# Patient Record
Sex: Female | Born: 1980 | Hispanic: Yes | Marital: Married | Smoking: Never smoker
Health system: Southern US, Community
[De-identification: ages and names within clinical notes are randomized; demographics above are authoritative.]

## PROBLEM LIST (undated history)

## (undated) DIAGNOSIS — I10 Essential (primary) hypertension: Secondary | ICD-10-CM

---

## 2005-01-21 ENCOUNTER — Emergency Department: Payer: Self-pay | Admitting: Emergency Medicine

## 2005-07-02 ENCOUNTER — Ambulatory Visit: Payer: Self-pay | Admitting: *Deleted

## 2006-02-10 ENCOUNTER — Ambulatory Visit (HOSPITAL_COMMUNITY): Admission: RE | Admit: 2006-02-10 | Discharge: 2006-02-10 | Payer: Self-pay | Admitting: Specialist

## 2009-10-03 ENCOUNTER — Ambulatory Visit (HOSPITAL_COMMUNITY): Admission: RE | Admit: 2009-10-03 | Discharge: 2009-10-03 | Payer: Self-pay | Admitting: Obstetrics

## 2013-01-07 ENCOUNTER — Encounter (HOSPITAL_COMMUNITY): Payer: Self-pay | Admitting: Emergency Medicine

## 2013-01-07 ENCOUNTER — Emergency Department (HOSPITAL_COMMUNITY)
Admission: EM | Admit: 2013-01-07 | Discharge: 2013-01-07 | Disposition: A | Discharge status: Home / Self Care | Payer: Self-pay | Attending: Emergency Medicine | Admitting: Emergency Medicine

## 2013-01-07 ENCOUNTER — Emergency Department (HOSPITAL_COMMUNITY): Payer: Self-pay

## 2013-01-07 DIAGNOSIS — J069 Acute upper respiratory infection, unspecified: Secondary | ICD-10-CM | POA: Insufficient documentation

## 2013-01-07 DIAGNOSIS — I10 Essential (primary) hypertension: Secondary | ICD-10-CM | POA: Insufficient documentation

## 2013-01-07 DIAGNOSIS — M549 Dorsalgia, unspecified: Secondary | ICD-10-CM | POA: Insufficient documentation

## 2013-01-07 DIAGNOSIS — J40 Bronchitis, not specified as acute or chronic: Secondary | ICD-10-CM

## 2013-01-07 DIAGNOSIS — J209 Acute bronchitis, unspecified: Secondary | ICD-10-CM | POA: Insufficient documentation

## 2013-01-07 HISTORY — DX: Essential (primary) hypertension: I10

## 2013-01-07 MED ORDER — ALBUTEROL SULFATE (5 MG/ML) 0.5% IN NEBU
2.5000 mg | INHALATION_SOLUTION | Freq: Once | RESPIRATORY_TRACT | Status: AC
  Administered 2013-01-07: 2.5 mg via RESPIRATORY_TRACT
  Filled 2013-01-07: qty 0.5

## 2013-01-07 MED ORDER — IBUPROFEN 800 MG PO TABS
800.0000 mg | ORAL_TABLET | Freq: Once | ORAL | Status: AC
  Administered 2013-01-07: 800 mg via ORAL
  Filled 2013-01-07: qty 1

## 2013-01-07 MED ORDER — ALBUTEROL SULFATE HFA 108 (90 BASE) MCG/ACT IN AERS
2.0000 | INHALATION_SPRAY | Freq: Once | RESPIRATORY_TRACT | Status: AC
  Administered 2013-01-07: 2 via RESPIRATORY_TRACT
  Filled 2013-01-07: qty 6.7

## 2013-01-07 MED ORDER — IPRATROPIUM BROMIDE 0.02 % IN SOLN
0.5000 mg | Freq: Once | RESPIRATORY_TRACT | Status: AC
  Administered 2013-01-07: 0.5 mg via RESPIRATORY_TRACT
  Filled 2013-01-07: qty 2.5

## 2013-01-07 MED ORDER — DIPHENHYDRAMINE HCL 12.5 MG/5ML PO ELIX
25.0000 mg | ORAL_SOLUTION | Freq: Once | ORAL | Status: AC
  Administered 2013-01-07: 25 mg via ORAL
  Filled 2013-01-07: qty 10

## 2013-01-07 NOTE — ED Provider Notes (Signed)
CSN: [id number]     Arrival date & time 01/07/13  1928 History   First MD Initiated Contact with Patient 01/07/13 1948     Chief Complaint  Patient presents with  . Bronchitis   (Consider location/radiation/quality/duration/timing/severity/associated sxs/prior Treatment) Patient is a 32 y.o. female presenting with cough. The history is provided by the patient.  Cough Cough characteristics:  Non-productive Severity:  Moderate Onset quality:  Gradual Duration:  4 days Progression:  Worsening Chronicity:  New Smoker: no   Context: sick contacts and weather changes   Relieved by:  Nothing Ineffective treatments:  Rest (amoxil) Associated symptoms: myalgias   Associated symptoms: no chest pain, no eye discharge, no shortness of breath and no wheezing   Risk factors: no recent travel     Past Medical History  Diagnosis Date  . Hypertension    History reviewed. No pertinent past surgical history. History reviewed. No pertinent family history. History  Substance Use Topics  . Smoking status: Never Smoker   . Smokeless tobacco: Not on file  . Alcohol Use: No   OB History   Grav Para Term Preterm Abortions TAB SAB Ect Mult Living                 Review of Systems  Constitutional: Negative for activity change.       All ROS Neg except as noted in HPI  HENT: Positive for congestion. Negative for nosebleeds.   Eyes: Negative for photophobia and discharge.  Respiratory: Positive for cough. Negative for shortness of breath and wheezing.   Cardiovascular: Negative for chest pain and palpitations.  Gastrointestinal: Negative for abdominal pain and blood in stool.  Genitourinary: Negative for dysuria, frequency and hematuria.  Musculoskeletal: Positive for back pain and myalgias. Negative for arthralgias and neck pain.  Skin: Negative.   Neurological: Negative for dizziness, seizures and speech difficulty.  Psychiatric/Behavioral: Negative for hallucinations and confusion.     Allergies  Review of patient's allergies indicates no known allergies.  Home Medications   Current Outpatient Rx  Name  Route  Sig  Dispense  Refill  . amoxicillin (AMOXIL) 875 MG tablet   Oral   Take 875 mg by mouth 2 (two) times daily. 10 day course starting on 01/06/2013         . predniSONE (DELTASONE) 20 MG tablet   Oral   Take 60 mg by mouth daily. 5 day course starting on 01/06/2013          BP 156/73  Pulse 80  Temp(Src) 97.7 F (36.5 C) (Oral)  Resp 20  Ht 5\' 1"  (1.549 m)  Wt 205 lb 3 oz (93.072 kg)  BMI 38.79 kg/m2  SpO2 99%  LMP 12/13/2012 Physical Exam  Nursing note and vitals reviewed. Constitutional: She is oriented to person, place, and time. She appears well-developed and well-nourished.  Non-toxic appearance.  HENT:  Head: Normocephalic.  Right Ear: Tympanic membrane and external ear normal.  Left Ear: Tympanic membrane and external ear normal.  Nasal congestion present.  Eyes: EOM and lids are normal. Pupils are equal, round, and reactive to light.  Neck: Normal range of motion. Neck supple. Carotid bruit is not present.  Cardiovascular: Normal rate, regular rhythm, normal heart sounds, intact distal pulses and normal pulses.   Pulmonary/Chest: No respiratory distress. She has wheezes. She has rhonchi. She exhibits tenderness.  Abdominal: Soft. Bowel sounds are normal. There is no tenderness. There is no guarding.  Musculoskeletal: Normal range of motion.  Lymphadenopathy:  Head (right side): No submandibular adenopathy present.       Head (left side): No submandibular adenopathy present.    She has no cervical adenopathy.  Neurological: She is alert and oriented to person, place, and time. She has normal strength. No cranial nerve deficit or sensory deficit.  Skin: Skin is warm and dry.  Psychiatric: She has a normal mood and affect. Her speech is normal.    ED Course  Procedures (including critical care time) Labs Review Labs  Reviewed - No data to display Imaging Review Dg Chest 2 View  01/07/2013   CLINICAL DATA:  Shortness of breath for 1 week and cough.  EXAM: CHEST  2 VIEW  COMPARISON:  None.  FINDINGS: Low lung volumes accentuate the heart size of toes normal. Low lung volumes also result in mild crowding of the pulmonary markings without infiltrate or atelectasis. No effusion or pneumothorax. No osseous findings.  IMPRESSION: No active cardiopulmonary disease.   Electronically Signed   By: Davonna Belling M.D.   On: 01/07/2013 20:23    EKG Interpretation   None       MDM  No diagnosis found. *I have reviewed nursing notes, vital signs, and all appropriate lab and imaging results for this patient.**  Chest xray reveals mld crowding, but no pneumonia or acute pulmonary problem.Suspect bronchitis and URI. PUlse ox 99% on room air. WNL by my interpretation. Plan add albuterol inhaler to the amoxil and prednisone currently taking.Pt to use benadryl for congestion. She will return if any changes or problem.  Kathie Dike, PA-C 01/08/13 1208

## 2013-01-07 NOTE — ED Notes (Signed)
Cough, no fever, hoarse, headache,  Seen by MD yesterday and started amoxicillin and prednisone.    Feels no better.

## 2013-01-26 NOTE — ED Provider Notes (Signed)
History/physical exam/procedure(s) were performed by non-physician practitioner and as supervising physician I was immediately available for consultation/collaboration. I have reviewed all notes and am in agreement with care and plan.   Hilario Quarry, MD 01/26/13 [phone]

## 2014-04-21 IMAGING — CR DG CHEST 2V
2 series · 2 of 2 positions shown · non-contrast
Comparison: None.

CLINICAL DATA: Shortness of breath for 1 week and cough.

EXAM:
CHEST  2 VIEW

[view not recorded (1 of 2)]
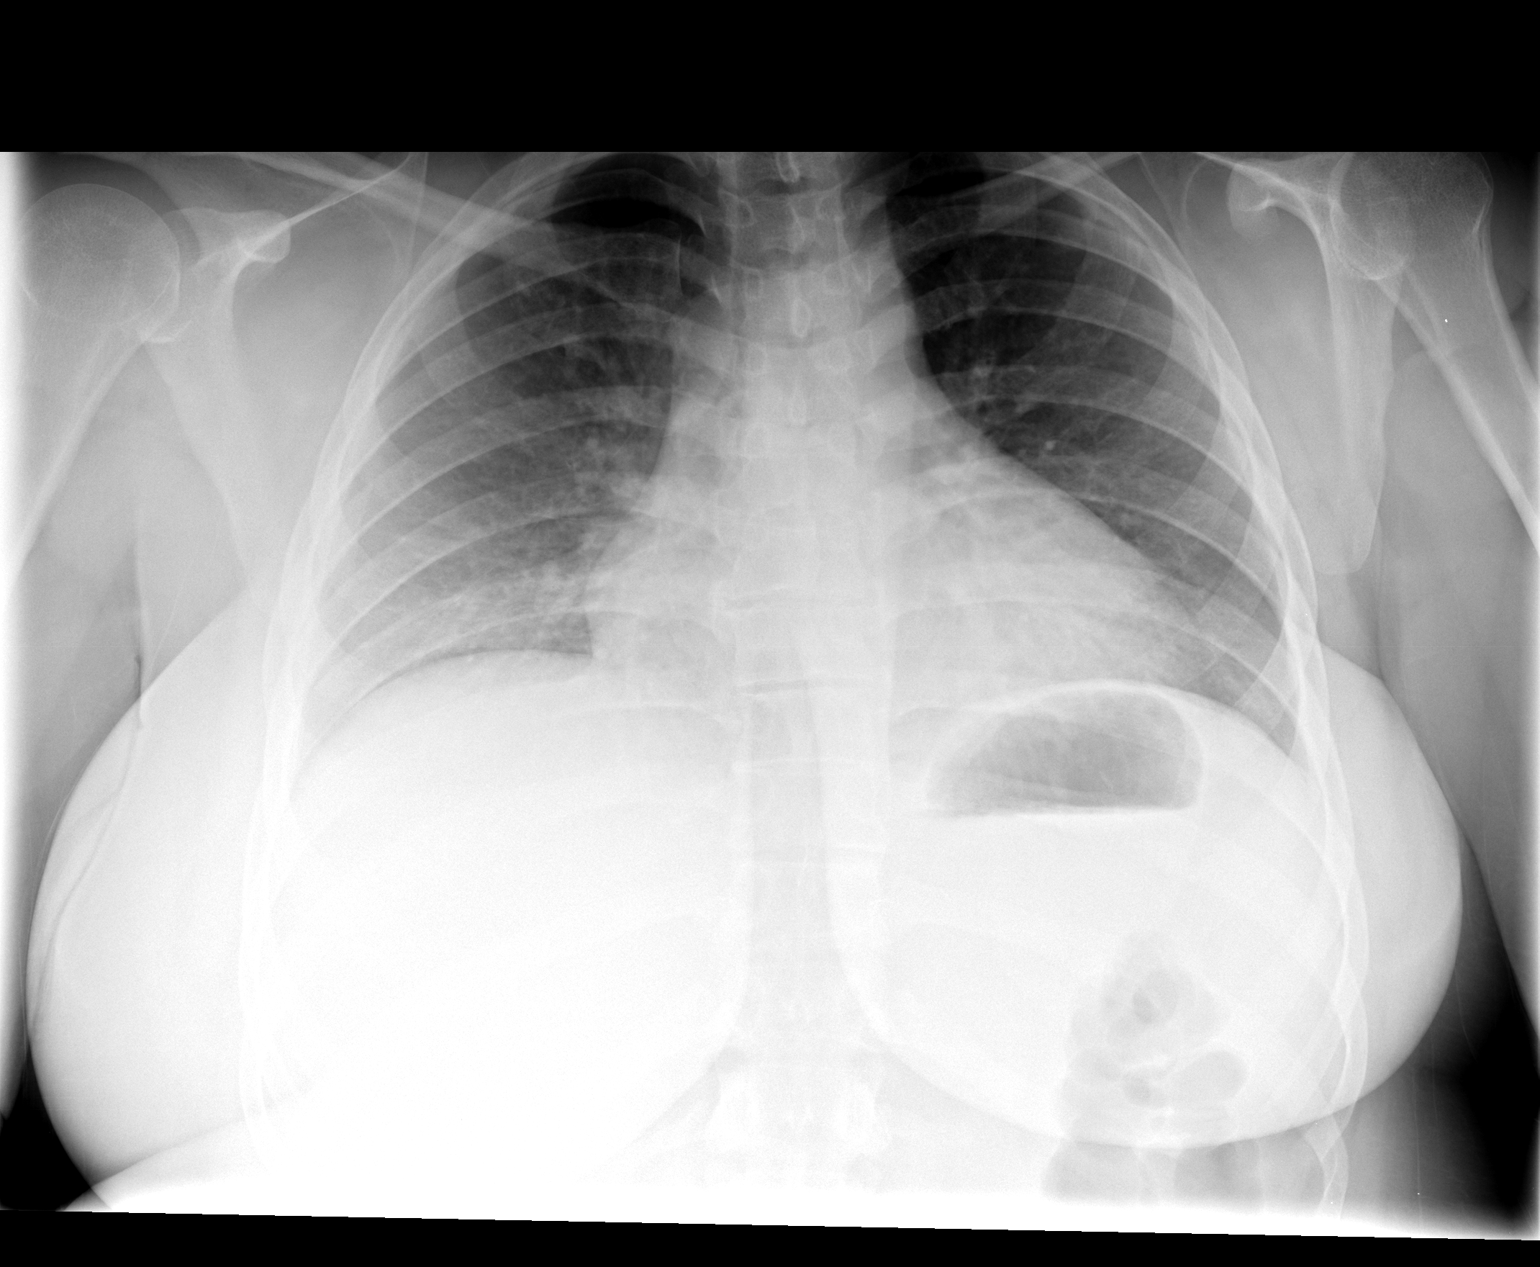

[view not recorded (2 of 2)]
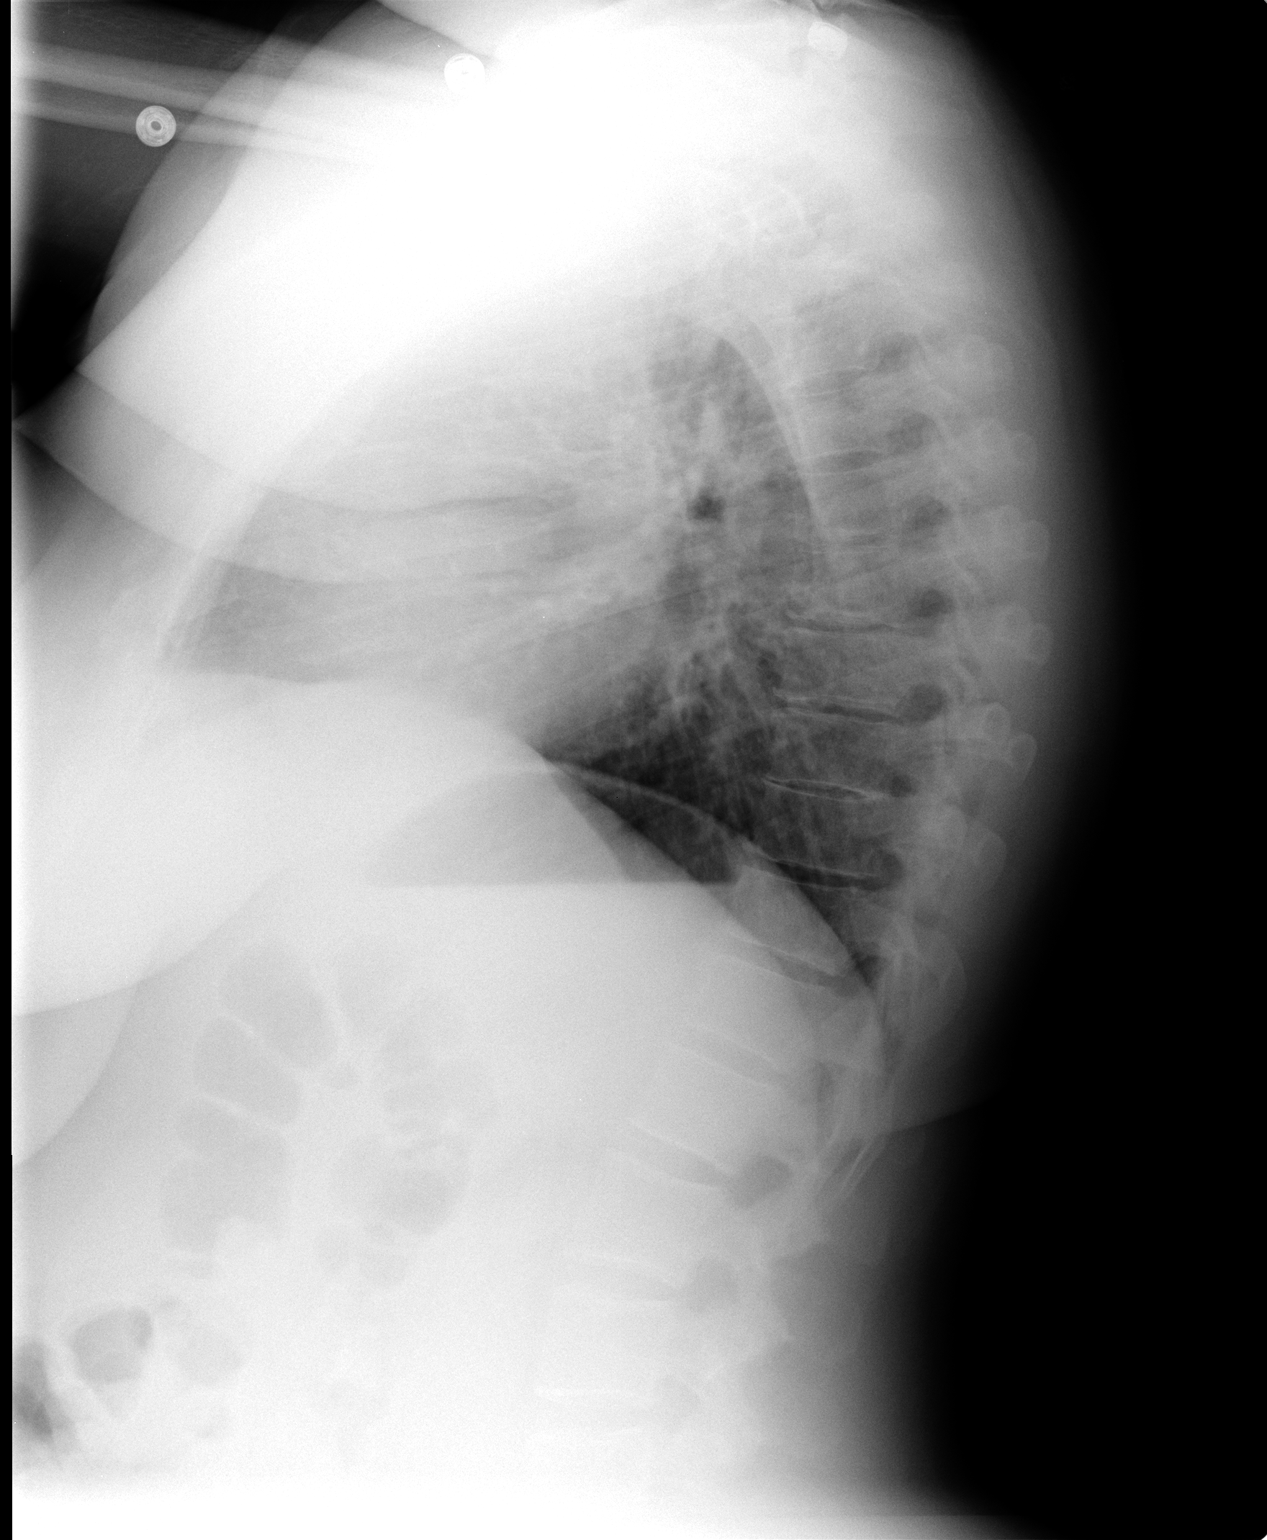

[2 of 2 positions shown; findings below may reference images not displayed]

FINDINGS: Low lung volumes accentuate the heart size of toes normal. Low lung
volumes also result in mild crowding of the pulmonary markings
without infiltrate or atelectasis. No effusion or pneumothorax. No
osseous findings.
IMPRESSION: No active cardiopulmonary disease.

## 2014-05-26 ENCOUNTER — Emergency Department (HOSPITAL_COMMUNITY)
Admission: EM | Admit: 2014-05-26 | Discharge: 2014-05-26 | Disposition: A | Discharge status: Home / Self Care | Payer: Self-pay | Attending: Emergency Medicine | Admitting: Emergency Medicine

## 2014-05-26 ENCOUNTER — Encounter (HOSPITAL_COMMUNITY): Payer: Self-pay | Admitting: *Deleted

## 2014-05-26 DIAGNOSIS — I10 Essential (primary) hypertension: Secondary | ICD-10-CM | POA: Insufficient documentation

## 2014-05-26 DIAGNOSIS — G510 Bell's palsy: Secondary | ICD-10-CM | POA: Insufficient documentation

## 2014-05-26 DIAGNOSIS — Z7952 Long term (current) use of systemic steroids: Secondary | ICD-10-CM | POA: Insufficient documentation

## 2014-05-26 DIAGNOSIS — Z792 Long term (current) use of antibiotics: Secondary | ICD-10-CM | POA: Insufficient documentation

## 2014-05-26 MED ORDER — ACYCLOVIR 400 MG PO TABS: 400.0000 mg | ORAL_TABLET | Freq: Every day | ORAL | Status: AC

## 2014-05-26 MED ORDER — PREDNISONE 20 MG PO TABS: ORAL_TABLET | ORAL | Status: DC

## 2014-05-26 NOTE — ED Notes (Signed)
Pt alert & oriented x4, stable gait. Patient given discharge instructions, paperwork & prescription(s). Patient  instructed to stop at the registration desk to finish any additional paperwork. Patient verbalized understanding. Pt left department w/ no further questions. 

## 2014-05-26 NOTE — ED Provider Notes (Addendum)
CSN: [id number]     Arrival date & time 05/26/14  4098 History   First MD Initiated Contact with Patient 05/26/14 [phone]     Chief Complaint  Patient presents with  . Facial Droop     (Consider location/radiation/quality/duration/timing/severity/associated sxs/prior Treatment) HPI Comments: Patient presents to the ER for evaluation of right facial droop and numbness. Patient reports that she woke this morning with symptoms, was fine last night. She noticed when she tried to brush her teeth at the toothpaste and water was drooping of the right side of her mouth. The right side of her face is very numb and tingly. She has noticed some blurred vision as well. She has not noticed any numbness, tingling or weakness in the extremities.   Past Medical History  Diagnosis Date  . Hypertension    History reviewed. No pertinent past surgical history. History reviewed. No pertinent family history. History  Substance Use Topics  . Smoking status: Never Smoker   . Smokeless tobacco: Not on file  . Alcohol Use: No   OB History    No data available     Review of Systems  Neurological: Positive for numbness (Right facial). Negative for headaches.  All other systems reviewed and are negative.     Allergies  Review of patient's allergies indicates no known allergies.  Home Medications   Prior to Admission medications   Medication Sig Start Date End Date Taking? Authorizing Provider  amoxicillin (AMOXIL) 875 MG tablet Take 875 mg by mouth 2 (two) times daily. 10 day course starting on 01/06/2013    Historical Provider, MD  predniSONE (DELTASONE) 20 MG tablet Take 60 mg by mouth daily. 5 day course starting on 01/06/2013    Historical Provider, MD   BP 167/101 mmHg  Pulse 88  Temp(Src) 98.1 F (36.7 C) (Oral)  Resp 20  Ht 5' (1.524 m)  Wt 201 lb (91.173 kg)  BMI 39.26 kg/m2  SpO2 99%  LMP 05/22/2014 (Exact Date) Physical Exam  Constitutional: She is oriented to person, place, and  time. She appears well-developed and well-nourished. No distress.  HENT:  Head: Normocephalic and atraumatic.  Right Ear: Hearing normal.  Left Ear: Hearing normal.  Nose: Nose normal.  Mouth/Throat: Oropharynx is clear and moist and mucous membranes are normal.  Eyes: Conjunctivae and EOM are normal. Pupils are equal, round, and reactive to light.  Neck: Normal range of motion. Neck supple.  Cardiovascular: Regular rhythm, S1 normal and S2 normal.  Exam reveals no gallop and no friction rub.   No murmur heard. Pulmonary/Chest: Effort normal and breath sounds normal. No respiratory distress. She exhibits no tenderness.  Abdominal: Soft. Normal appearance and bowel sounds are normal. There is no hepatosplenomegaly. There is no tenderness. There is no rebound, no guarding, no tenderness at McBurney's point and negative Murphy's sign. No hernia.  Musculoskeletal: Normal range of motion.  Neurological: She is alert and oriented to person, place, and time. She has normal strength. 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 No sensory deficit. Coordination normal. GCS eye subscore is 4. GCS verbal subscore is 5. GCS motor subscore is 6.  Patient has partial paralysis of the right facial nerve. This includes upper motor neuron on right, cannot completely close her right eye and has difficulty moving her forehead as well as the lower muscles of the face.  Extraocular muscle movement: normal No visual field cut Pupils: equal and reactive both direct and consensual response is normal No nystagmus  present    Sensory function is intact to light touch, pinprick Proprioception intact  Grip strength 5/5 symmetric in upper extremities No pronator drift Normal finger to nose bilaterally  Lower extremity strength 5/5 against gravity   Gait: normal   Skin: Skin is warm, dry and intact. No rash noted. No cyanosis.  Psychiatric: She has a normal mood and affect. Her speech is normal  and behavior is normal. Thought content normal.  Nursing note and vitals reviewed.   ED Course  Procedures (including critical care time) Labs Review Labs Reviewed - No data to display  Imaging Review No results found.   EKG Interpretation None      MDM   Final diagnoses:  None   Bell's palsy  Patient presents to the emergency department for evaluation of right facial numbness and weakness that was present upon waking this morning. Patient has an examination that is very consistent with Bell's palsy. She has partial paralysis of upper and lower motor neuron on the right, no other neurologic symptoms elsewhere in her body. Based on her examination, she does not require imaging. She will be treated empirically with prednisone and acyclovir.    Gilda Creasehristopher J Maicie Vanderloop, MD 05/26/14 [phone]  Gilda Creasehristopher J Ndidi Nesby, MD 05/26/14 [phone]

## 2014-05-26 NOTE — ED Notes (Signed)
Pt states that last night she had no symptoms, upon awakening pt states she had rt sided mouth numbness and lt eye blurred vision. Pt unable to close lt eye, EDP at bedside to assess, neuro exam other than facial problems WDL.

## 2014-05-26 NOTE — Discharge Instructions (Signed)
Parálisis de Bell °(Bell's Palsy) °La parálisis de Bell es un trastorno en el que los músculos de un lado de la cara no pueden moverse (parálisis). Esto se debe a que los nervios de la cara están paralizados. Se cree que es producido por un virus. El virus causa inflamación del nervio que controla los movimientos de un lado de la cara. El nervio pasa a través de un espacio angosto, rodeado de hueso. Cuando el nervio se inflama, el hueso puede comprimirlo. Esto da como resultado un daño a la cubierta protectora que rodea el nervio. Este daño interfiere con la forma en la que el nervio se comunica con los músculos de la cara. Como resultado, puede causar debilidad o parálisis en los músculos faciales.  °Las lesiones (traumatismos), tumores y cirugía pueden causar parálisis de Bell, pero la mayoría de las veces se desconoce la causa. Es un trastorno bastante frecuente. Comienza de repente (aparición abrupta) y la parálisis por lo general desaparece en 2 días. La parálisis de Bell no es peligrosa. Pero como el ojo no se cierra adecuadamente, necesitará tomar algunos recaudos para que no se seque. Esto puede incluir un vendaje (mantener el ojo cerrado) o humedecerlo con lágrimas artificiales. Rara vez ocurre en ambos lados al mismo tiempo. °SÍNTOMAS °· Ceja caída. °· Caída del ojo y comisura de la boca. °· Incapacidad para cerrar un ojo. °· Pérdida del sentido del gusto en la parte anterior de la lengua. °· Sensibilidad a ruidos fuertes. °TRATAMIENTO °El tratamiento por lo general no es quirúrgico. Si el paciente fue atendido dentro de las primeras 24 a 48 horas puede que se le haya prescrito un tratamiento corto con esteroides para intentar acortar el curso de la enfermedad. También se podrán utilizar medicamentos antivirales junto con los esteroides, pero no está claro si son útiles.  °Necesitará proteger el ojo si no puede cerrarlo. La córnea (cubierta transparente del ojo) podría secarse y dañarse. Se podrán utilizar  lágrimas artificiales para mantener el ojo lubricado. Se deberán utilizar lentes o parches para proteger el ojo. °PRONÓSTICO °El tiempo de recuperación es variable y puede tardar desde algunos días a varios meses. Aunque en general se cura completamente, (en un 80% de los casos aproximadamente), las consecuencias no pueden predecirse. La mayoría de las personas mejoran dentro de las 3 semanas del comienzo de los síntomas. Las mejoras deberán continuar por entre 3 y 6 meses. Una pequeña cantidad de personas presentan debilidad moderada a grave que es permanente.  °INSTRUCCIONES PARA EL CUIDADO DOMICILIARIO °· Si su médico le prescribe medicamentos para controlar la inflamación del nervio, tómela de la manera indicada. No suspenda los medicamentos a menos que se lo haya indicado el profesional que le asiste. °· Utilice gotas oculares para humedecer los ojos y prevenir la sequedad, de la manera en que se le indique. °· Proteja sus ojos de la manera en que se lo indique el profesional que le asiste. °· Practique masajes faciales y ejercicios de la manera que se lo indique el profesional que le asiste. °· Realice sus actividades normales y procure un descanso regular. °SOLICITE ATENCIÓN MÉDICA DE INMEDIATO SI: °· Presenta dolor, enrojecimiento o irritación en el ojo. °· Usted o su niño tienen una temperatura oral de más de 38,9° C (102° F) y no puede controlarla con medicamentos. °ESTÉ SEGURO QUE:  °· Comprende las instrucciones para el alta médica. °· Controlará su enfermedad. °· Solicitará atención médica de inmediato según las indicaciones. °Document Released: 01/13/2005 Document Revised: 04/07/2011 °[employer]® Patient   Information ©2015 [employer]. This information is not intended to replace advice given to you by your health care provider. Make sure you discuss any questions you have with your health care provider. ° °

## 2023-10-07 ENCOUNTER — Other Ambulatory Visit: Payer: Self-pay

## 2023-10-07 ENCOUNTER — Emergency Department (HOSPITAL_COMMUNITY)
Admission: EM | Admit: 2023-10-07 | Discharge: 2023-10-08 | Disposition: A | Discharge status: Home / Self Care | Payer: MEDICAID | Attending: Emergency Medicine | Admitting: Emergency Medicine

## 2023-10-07 ENCOUNTER — Encounter (HOSPITAL_COMMUNITY): Payer: Self-pay

## 2023-10-07 DIAGNOSIS — J40 Bronchitis, not specified as acute or chronic: Secondary | ICD-10-CM | POA: Insufficient documentation

## 2023-10-07 DIAGNOSIS — R059 Cough, unspecified: Secondary | ICD-10-CM | POA: Insufficient documentation

## 2023-10-07 DIAGNOSIS — I10 Essential (primary) hypertension: Secondary | ICD-10-CM | POA: Insufficient documentation

## 2023-10-07 LAB — RESP PANEL BY RT-PCR (RSV, FLU A&B, COVID)  RVPGX2
Influenza A by PCR: NEGATIVE
Influenza B by PCR: NEGATIVE
Resp Syncytial Virus by PCR: NEGATIVE
SARS Coronavirus 2 by RT PCR: NEGATIVE

## 2023-10-07 LAB — GROUP A STREP BY PCR: Group A Strep by PCR: NOT DETECTED

## 2023-10-07 NOTE — ED Triage Notes (Signed)
 Patient from home for difficulty breathing and cough that started last Wednesday. Reports the cough was initially productive but now it is dry. Also reports sore throat. Upon arrival to ER, patient is alert and oriented, ambu

## 2023-10-08 ENCOUNTER — Emergency Department (HOSPITAL_COMMUNITY): Payer: MEDICAID

## 2023-10-08 MED ORDER — IPRATROPIUM-ALBUTEROL 0.5-2.5 (3) MG/3ML IN SOLN
3.0000 mL | Freq: Once | RESPIRATORY_TRACT | Status: AC
  Administered 2023-10-08: 3 mL via RESPIRATORY_TRACT
  Filled 2023-10-08: qty 3

## 2023-10-08 MED ORDER — ALBUTEROL SULFATE HFA 108 (90 BASE) MCG/ACT IN AERS
2.0000 | INHALATION_SPRAY | Freq: Once | RESPIRATORY_TRACT | Status: AC
  Administered 2023-10-08: 2 via RESPIRATORY_TRACT
  Filled 2023-10-08: qty 6.7

## 2023-10-08 MED ORDER — PREDNISONE 10 MG PO TABS
60.0000 mg | ORAL_TABLET | Freq: Once | ORAL | Status: AC
  Administered 2023-10-08: 60 mg via ORAL
  Filled 2023-10-08: qty 1

## 2023-10-08 MED ORDER — AZITHROMYCIN 250 MG PO TABS: 250.0000 mg | ORAL_TABLET | Freq: Every day | ORAL | 0 refills | Status: AC

## 2023-10-08 MED ORDER — PREDNISONE 20 MG PO TABS: ORAL_TABLET | ORAL | 0 refills | Status: AC

## 2023-10-08 NOTE — ED Provider Notes (Signed)
 Emergency Department Provider Note  TRIAGE NOTE: Patient from home for difficulty breathing and cough that started last Wednesday. Reports the cough was initially productive but now it is dry. Also reports sore throat. Upon arrival to ER, patient is alert and oriented, ambu  HISTORY  Chief Complaint Shortness of Breath   HPI Jaclyn Evans is a 43 y.o. female with  a chief complaint of abnormal voice changes and throat pain that began three days ago. The patient reports that speaking causes discomfort, and there is associated pain when swallowing. There is no mention of fever, and the patient has not been exposed to any known sick contacts. The patient has a history of coughing, and there is noted redness in the throat, possibly due to irritation from coughing. The patient denies any history of strep throat. COVID and strep tests were negative. The history was obtained from the patient's sister.  PMH Past Medical History:  Diagnosis Date   Hypertension     Home Medications Prior to Admission medications   Medication Sig Start Date End Date Taking? Authorizing Provider  azithromycin  (ZITHROMAX ) 250 MG tablet Take 1 tablet (250 mg total) by mouth daily. Take first 2 tablets together, then 1 every day until finished. 10/08/23  Yes Pope Brunty, Selinda, MD  predniSONE  (DELTASONE ) 20 MG tablet 2 tabs po daily x 4 days 10/08/23  Yes Maud Rubendall, Selinda, MD  acyclovir  (ZOVIRAX ) 400 MG tablet Take 1 tablet (400 mg total) by mouth 5 (five) times daily. 05/26/14   Haze Lonni PARAS, MD    Social History Social History   Tobacco Use   Smoking status: Never  Substance Use Topics   Alcohol use: No   Drug use: No    Review of Systems: Documented in HPI ____________________________________________  PHYSICAL EXAM: VITAL SIGNS: Triage: Blood pressure 134/77, pulse 80, temperature 98.4 F (36.9 C), temperature source Oral, resp. rate 18, height 5' 1 (1.549 m), weight 84.8 kg, last  menstrual period 08/28/2023, SpO2 94%.  Vitals:   10/07/23 2345 10/08/23 0000 10/08/23 0100 10/08/23 0130  BP: (!) 142/78 (!) 139/109 (!) 145/79 134/77  Pulse: 73 71 76 80  Resp: 18 18  18   Temp: 98.3 F (36.8 C)   98.4 F (36.9 C)  TempSrc: Oral   Oral  SpO2: 96% 96% 98% 94%  Weight:      Height:        Physical Exam Vitals and nursing note reviewed.  Constitutional:      Appearance: She is well-developed.  HENT:     Head: Normocephalic and atraumatic.  Cardiovascular:     Rate and Rhythm: Normal rate and regular rhythm.  Pulmonary:     Effort: No respiratory distress.     Breath sounds: No stridor. Wheezing present.  Abdominal:     General: There is no distension.  Musculoskeletal:     Cervical back: Normal range of motion.  Neurological:     Mental Status: She is alert.       ____________________________________________   LABS (all labs ordered are listed, but only abnormal results are displayed)  Labs Reviewed  GROUP A STREP BY PCR  RESP PANEL BY RT-PCR (RSV, FLU A&B, COVID)  [id number]   ____________________________________________  EKG   EKG Interpretation Date/Time:    Ventricular Rate:    PR Interval:    QRS Duration:    QT Interval:    QTC Calculation:   R Axis:      Text Interpretation:  ____________________________________________  RADIOLOGY  DG Chest 2 View Result Date: 10/08/2023 CLINICAL DATA:  Chest pain, congestion, cough EXAM: DG CHEST 2V COMPARISON:  01/07/2013 FINDINGS: Low lung volumes. Heart and mediastinal contours are within normal limits. No focal opacities or effusions. No acute bony abnormality. IMPRESSION: Low volumes.  No active cardiopulmonary disease. Electronically Signed   By: Franky Crease M.D.   On: 10/08/2023 01:05   ____________________________________________  PROCEDURES  Procedure(s) performed:   Procedures ____________________________________________  INITIAL IMPRESSION / ASSESSMENT AND PLAN      Initial DDx:        ED Course   Suspect URI w/ bronchitis/laryngitis. Low suspicion for PNA, epiglotitis, abscess or other acute emergent etiology requiring further imaging or hospitalization.       Images ordered viewed and obtained by myself. Agree with Radiology interpretation. Details in ED course.  Labs ordered reviewed by myself as detailed in ED course.  Consultations obtained/considered detailed in ED course.    FINAL IMPRESSION Final diagnoses:  Bronchitis     Disposition A medical screening exam was performed and I feel the patient has had an appropriate workup for their chief complaint at this time and likelihood of emergent condition existing is low. They have been counseled on decision, DISCHARGE, follow up and which symptoms necessitate immediate return to the emergency department. They or their family verbally stated understanding and agreement with plan and discharged in stable condition.   ____________________________________________   NEW OUTPATIENT MEDICATIONS STARTED DURING THIS VISIT:  Discharge Medication List as of 10/08/2023  1:44 AM     START taking these medications   Details  azithromycin  (ZITHROMAX ) 250 MG tablet Take 1 tablet (250 mg total) by mouth daily. Take first 2 tablets together, then 1 every day until finished., Starting Thu 10/08/2023, Normal        Note:  This note was prepared with assistance of Dragon voice recognition software. Occasional wrong-word or sound-a-like substitutions may have occurred due to the inherent limitations of voice recognition software.    Sheriece Jefcoat, Selinda, MD 10/08/23 [phone]

## 2023-10-08 NOTE — ED Notes (Signed)
 ED Provider at bedside.

## 2023-10-08 NOTE — ED Notes (Signed)
 Patient transported to X-ray
